# Patient Record
Sex: Male | Born: 1975 | Hispanic: No | Marital: Single | Smoking: Never smoker
Health system: Southern US, Community
[De-identification: ages and names within clinical notes are randomized; demographics above are authoritative.]

---

## 2019-07-31 ENCOUNTER — Encounter (HOSPITAL_BASED_OUTPATIENT_CLINIC_OR_DEPARTMENT_OTHER): Payer: Self-pay | Admitting: Emergency Medicine

## 2019-07-31 ENCOUNTER — Other Ambulatory Visit: Payer: Self-pay

## 2019-07-31 DIAGNOSIS — Y9389 Activity, other specified: Secondary | ICD-10-CM | POA: Insufficient documentation

## 2019-07-31 DIAGNOSIS — S96922A Laceration of unspecified muscle and tendon at ankle and foot level, left foot, initial encounter: Secondary | ICD-10-CM | POA: Insufficient documentation

## 2019-07-31 DIAGNOSIS — Y9289 Other specified places as the place of occurrence of the external cause: Secondary | ICD-10-CM | POA: Insufficient documentation

## 2019-07-31 DIAGNOSIS — Y999 Unspecified external cause status: Secondary | ICD-10-CM | POA: Insufficient documentation

## 2019-07-31 DIAGNOSIS — X500XXA Overexertion from strenuous movement or load, initial encounter: Secondary | ICD-10-CM | POA: Insufficient documentation

## 2019-07-31 NOTE — ED Triage Notes (Addendum)
Pt heard prior to triage through triage doors yelling at registration staff and using profanity. Pt can be heard yelling about not being called back yet and that "I pay taxes", and calling staff "racist". For this reason, triage completed with this RN and CN together.  Pt presents in ED c/o L foot pain. Pt motions to his visitor and says "she is my translator" in [place]. Pt refuses to answer some questions, and corrects or talks over the visitor for others. Pt/visitor state he was working in the shed 2 weeks ago and something heavy fell on his foot. Initially foot improved but ~4 days ago pt had increase in pain and swelling and "unable to bear weight". Visitor states he has taken Motrin, Tylenol and Codeine without relief. Pt/visitor notified that no rooms are available at this time due to high census but that he would be called back to a room as soon as one is available for him.

## 2019-08-01 ENCOUNTER — Emergency Department (HOSPITAL_BASED_OUTPATIENT_CLINIC_OR_DEPARTMENT_OTHER): Payer: Self-pay

## 2019-08-01 ENCOUNTER — Emergency Department (HOSPITAL_BASED_OUTPATIENT_CLINIC_OR_DEPARTMENT_OTHER)
Admission: EM | Admit: 2019-08-01 | Discharge: 2019-08-01 | Disposition: A | Discharge status: Home / Self Care | Payer: Self-pay | Attending: Emergency Medicine | Admitting: Emergency Medicine

## 2019-08-01 ENCOUNTER — Other Ambulatory Visit: Payer: Self-pay

## 2019-08-01 DIAGNOSIS — T148XXA Other injury of unspecified body region, initial encounter: Secondary | ICD-10-CM

## 2019-08-01 MED ORDER — OXYCODONE-ACETAMINOPHEN 5-325 MG PO TABS: 1.0000 | ORAL_TABLET | ORAL | 0 refills | Status: AC | PRN

## 2019-08-01 NOTE — ED Notes (Addendum)
Pt was yelling out in the waiting room at the [employer] and security. I went out to speak with patient explaining that he would not be allowed to yell and curse at staff. Pt was yelling/cursing this RN because he had not been taken directly back to a room after triage. Explained multiple times that we would obtain an x-ray while he was in triage,but that currently all rooms in the back were occupied. Pt did not like this answer and continued to curse staff.

## 2019-08-01 NOTE — ED Notes (Signed)
Pt visualized getting up from wheelchair and walking out the front door without assistance.

## 2019-08-01 NOTE — ED Notes (Addendum)
Registration staff called to state patient had rolled off the couch he was laying on in the lobby onto the floor. This RN and Charna Elizabeth, RN went to lobby to assess patient. He was lying in the floor. No obvious injury noted. Family member who had not previously been with the patient states he has been "drinking all day" and kept telling the patient to "be quiet" when talking inappropriately (using profanity)  to staff. Pt made a gesture to his family member as if he was cutting his throat, but pointing at this RN. The entire ED visit this patient has been yelling and talking inappropriately to staff including security and off duty [employer]. Dr. Read Drivers aware of this.

## 2019-08-01 NOTE — ED Provider Notes (Signed)
[facility] Provider Note: Georgena Spurling, MD, FACEP  CSN: [id number] MRN: [id number] ARRIVAL: 07/31/19 at 2244 ROOM: [place] [facility]   HISTORY OF PRESENT ILLNESS  08/01/19 2:17 AM Miguel Small is a 44 y.o. male who complains of pain in his lateral left foot following an inversion injury a week ago (NB: Nursing notes indicate he was working in his shed 2 weeks ago and something heavy fell on his foot).  He states the pain had improved but became worse over the past day with increased swelling and he is unable to bear weight.  He rates his pain is a 10 out of 10, worse with movement or attempted weightbearing he states that he is taken ibuprofen and Tylenol with codeine without adequate relief.  He denies putting any topical medication on it.   History reviewed. No pertinent past medical history.  History reviewed. No pertinent surgical history.  No family history on file.  Social History   Tobacco Use  . Smoking status: Never Smoker  Substance Use Topics  . Alcohol use: Never  . Drug use: Never    Prior to Admission medications   Medication Sig Start Date End Date Taking? Authorizing Provider  oxyCODONE-acetaminophen (PERCOCET) 5-325 MG tablet Take 1 tablet by mouth every 4 (four) hours as needed for severe pain. 08/01/19   Nguyet Mercer, Jenny Reichmann, MD    Allergies Patient has no known allergies.   REVIEW OF SYSTEMS  Negative except as noted here or in the History of Present Illness.   PHYSICAL EXAMINATION  Initial Vital Signs Blood pressure 134/82, pulse 100, temperature 98.7 F (37.1 C), temperature source Oral, resp. rate 18, height 5\' 11"  (1.803 m), weight 99.8 kg, SpO2 100 %.  Examination General: Well-developed, well-nourished male in no acute distress; appearance consistent with age of record HENT: normocephalic; atraumatic Eyes: pupils equal, round and reactive to light; extraocular muscles intact Neck: supple Heart: regular rate  and rhythm Lungs: clear to auscultation bilaterally Abdomen: soft; nondistended; nontender; no masses or hepatosplenomegaly; bowel sounds present Extremities: No deformity; full range of motion; pulses normal; tenderness, swelling and soft tissue irregularity of left foot with somewhat regular pattern of superficial abrasions or burns, obviously covered with some type of ointment:    Neurologic: Awake, alert; motor function intact in all extremities and symmetric; no facial droop Skin: Warm and dry Psychiatric: Angry mood with congruent affect   RESULTS  Summary of this visit's results, reviewed and interpreted by myself:   EKG Interpretation  Date/Time:  Sunday August 01 2019 02:27:31 EDT Ventricular Rate:  85 PR Interval:    QRS Duration: 80 QT Interval:  327 QTC Calculation: 389 R Axis:   4 Text Interpretation: Sinus rhythm Abnormal R-wave progression, early transition Inferior infarct, age indeterminate Lateral leads are also involved Baseline wander in lead(s) V2 No previous ECGs available Confirmed by Shanon Rosser [phone]) on 08/01/2019 2:33:47 AM      Laboratory Studies: No results found for this or any previous visit (from the past 24 hour(s)). Imaging Studies: DG Foot Complete Left  Result Date: 08/01/2019 CLINICAL DATA:  Foot pain EXAM: LEFT FOOT - COMPLETE 3+ VIEW COMPARISON:  None. FINDINGS: There is no evidence of fracture or dislocation. There is no evidence of arthropathy or other focal bone abnormality. The Achilles tendon appears to be intact. There is striated appearance to the myotendinous junction with question of a focal disruption of the posterior muscular compartment of the ankle. Mild dorsal  soft tissue swelling. IMPRESSION: There is muscular edema with a question of a focal disruption of the posterior muscular compartment of the ankle. If further evaluation is required would recommend MRI No acute osseous abnormality. Electronically Signed   By: Jonna Clark  M.D.   On: 08/01/2019 02:20    ED COURSE and MDM  Nursing notes, initial and subsequent vitals signs, including pulse oximetry, reviewed and interpreted by myself.  Vitals:   07/31/19 2302 08/01/19 0155  BP: (!) 134/91 134/82  Pulse: 89 100  Resp: (!) 21 18  Temp: 98.7 F (37.1 C)   TempSrc: Oral   SpO2: 95% 100%  Weight: 99.8 kg   Height: 5\' 11"  (1.803 m)    Medications - No data to display  The sudden worsening of pain and inability to bear weight after initial period of improvement suggest a muscle rupture.  This would be consistent with x-ray findings.  We will splint him and have him follow-up with orthopedics.  2:31 AM The patient's nurse now advises me he burned it with the tip of a barbecue prong in an effort to bring the swelling down.  As noted above the patient story has not been consistent from staff member to staff member.  PROCEDURES  Procedures   ED DIAGNOSES     ICD-10-CM   1. Muscle rupture  T14.8XXA        Aronda Burford, , MD 08/01/19 [phone]

## 2020-08-18 IMAGING — DX DG FOOT COMPLETE 3+V*L*
3 series · 3 of 3 positions shown · non-contrast
Comparison: None.

CLINICAL DATA: Foot pain

EXAM:
LEFT FOOT - COMPLETE 3+ VIEW

[foot ap]
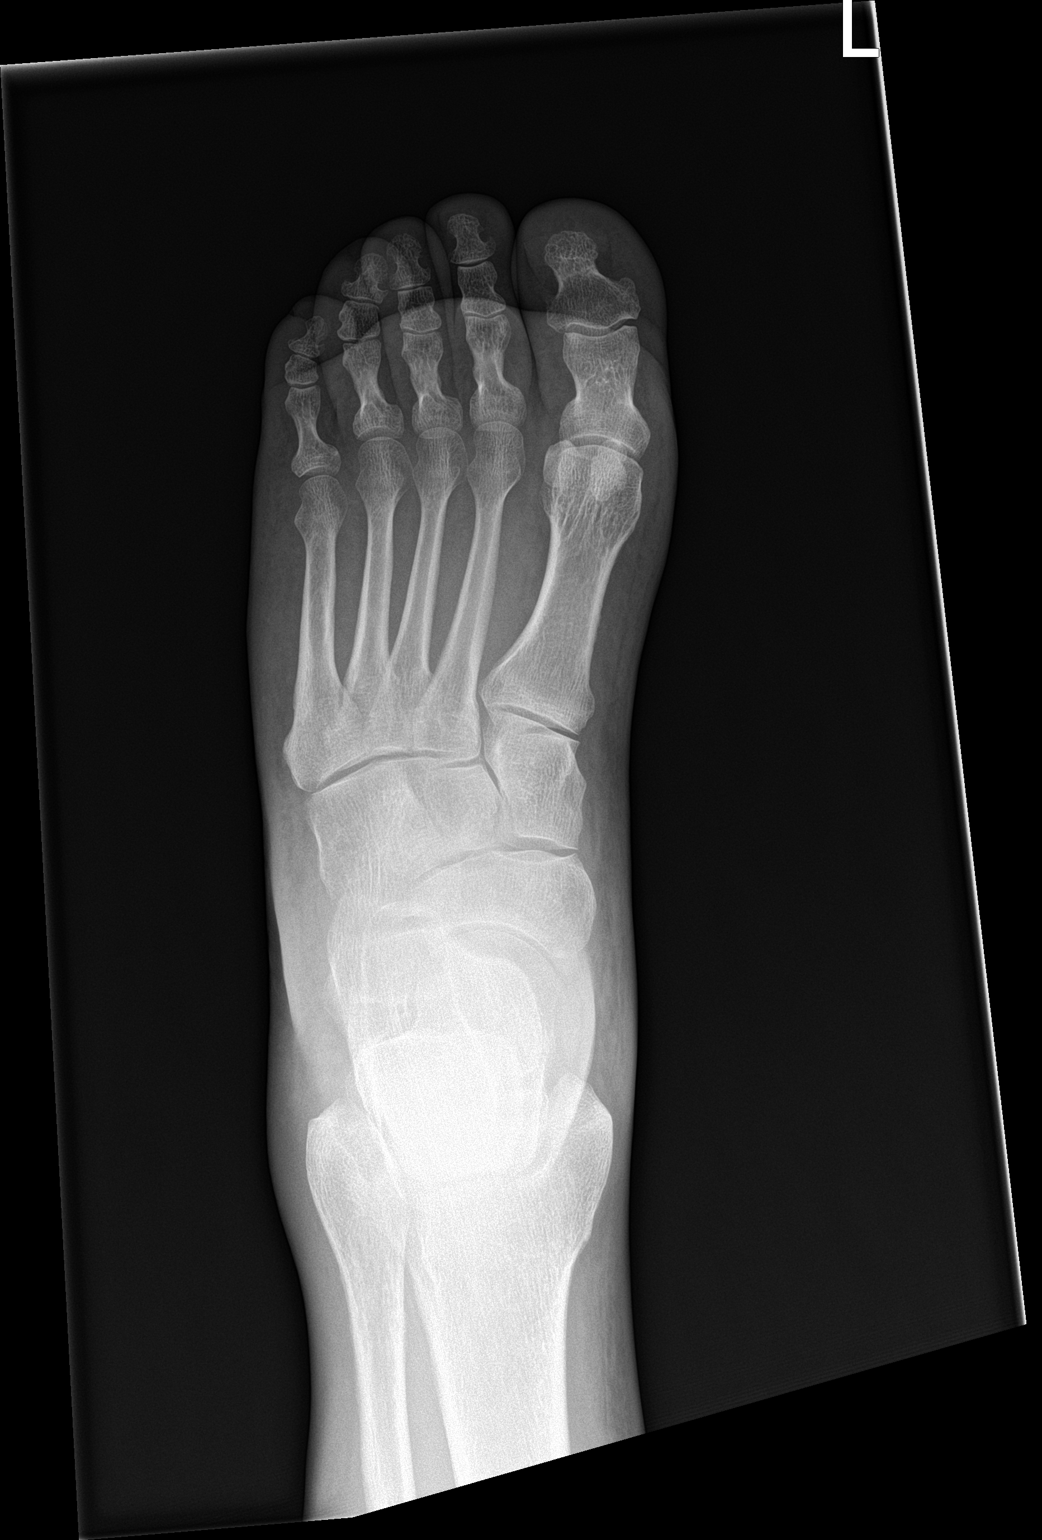

[foot obl]
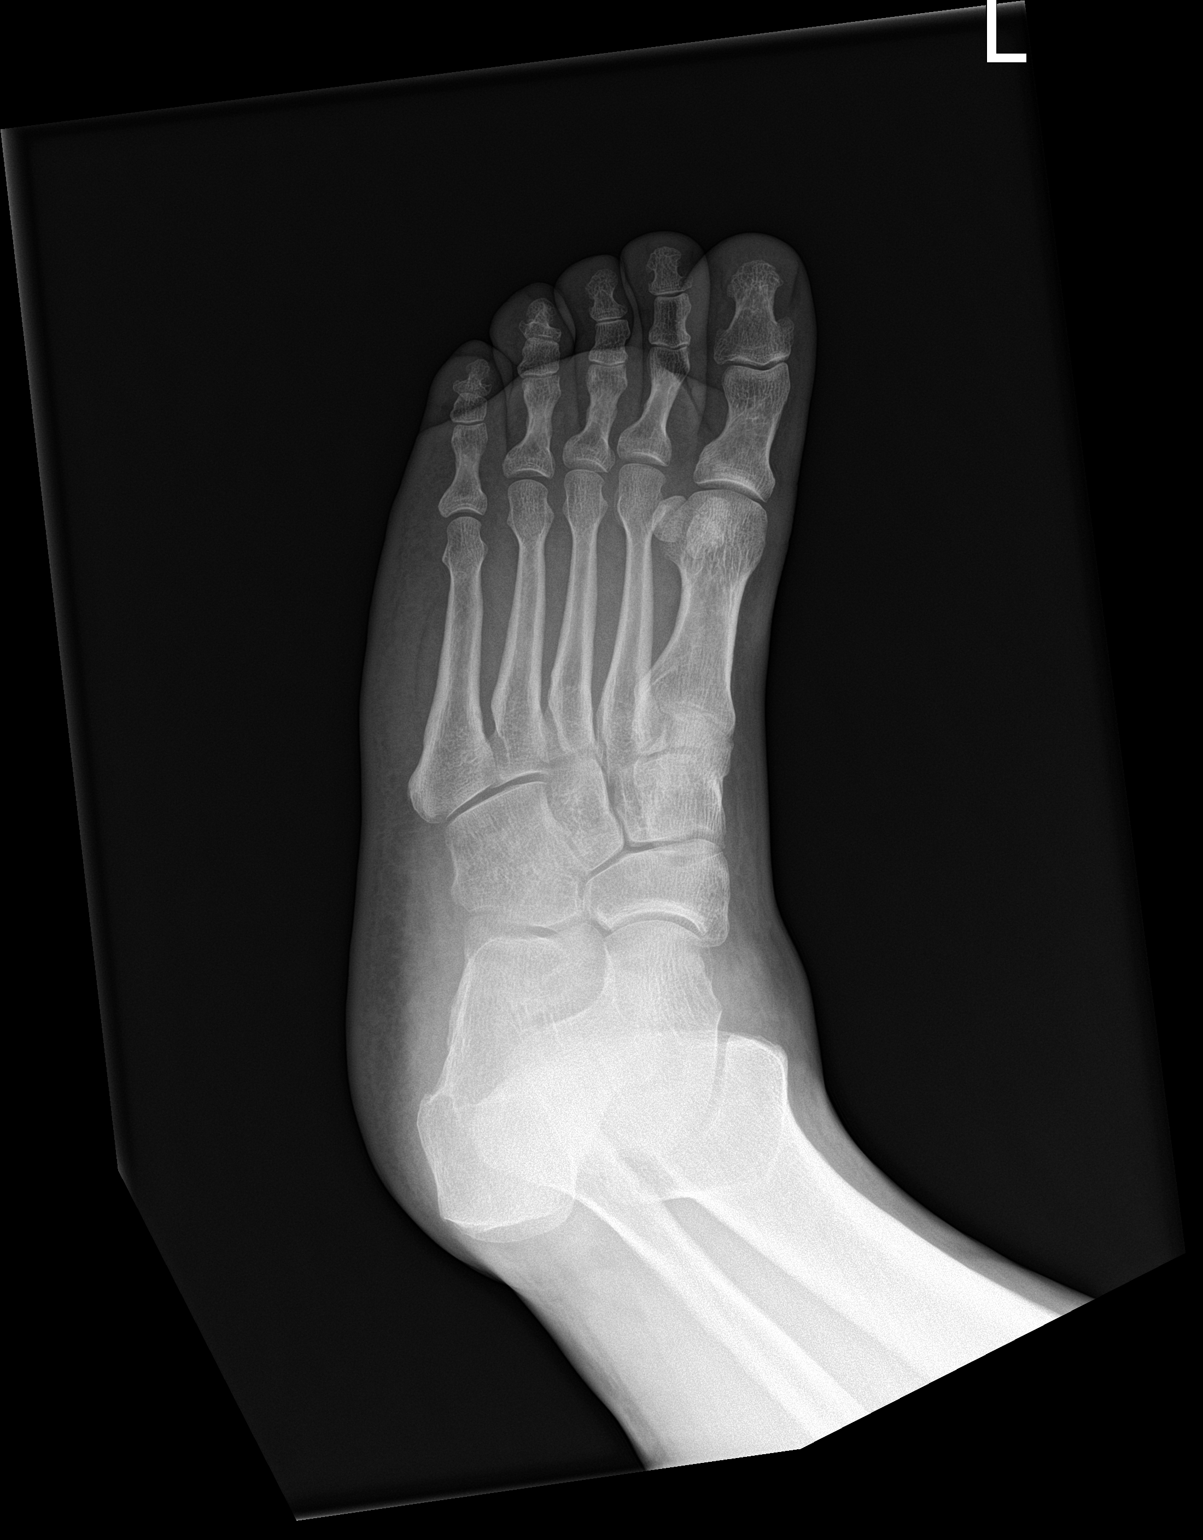

[foot lat]
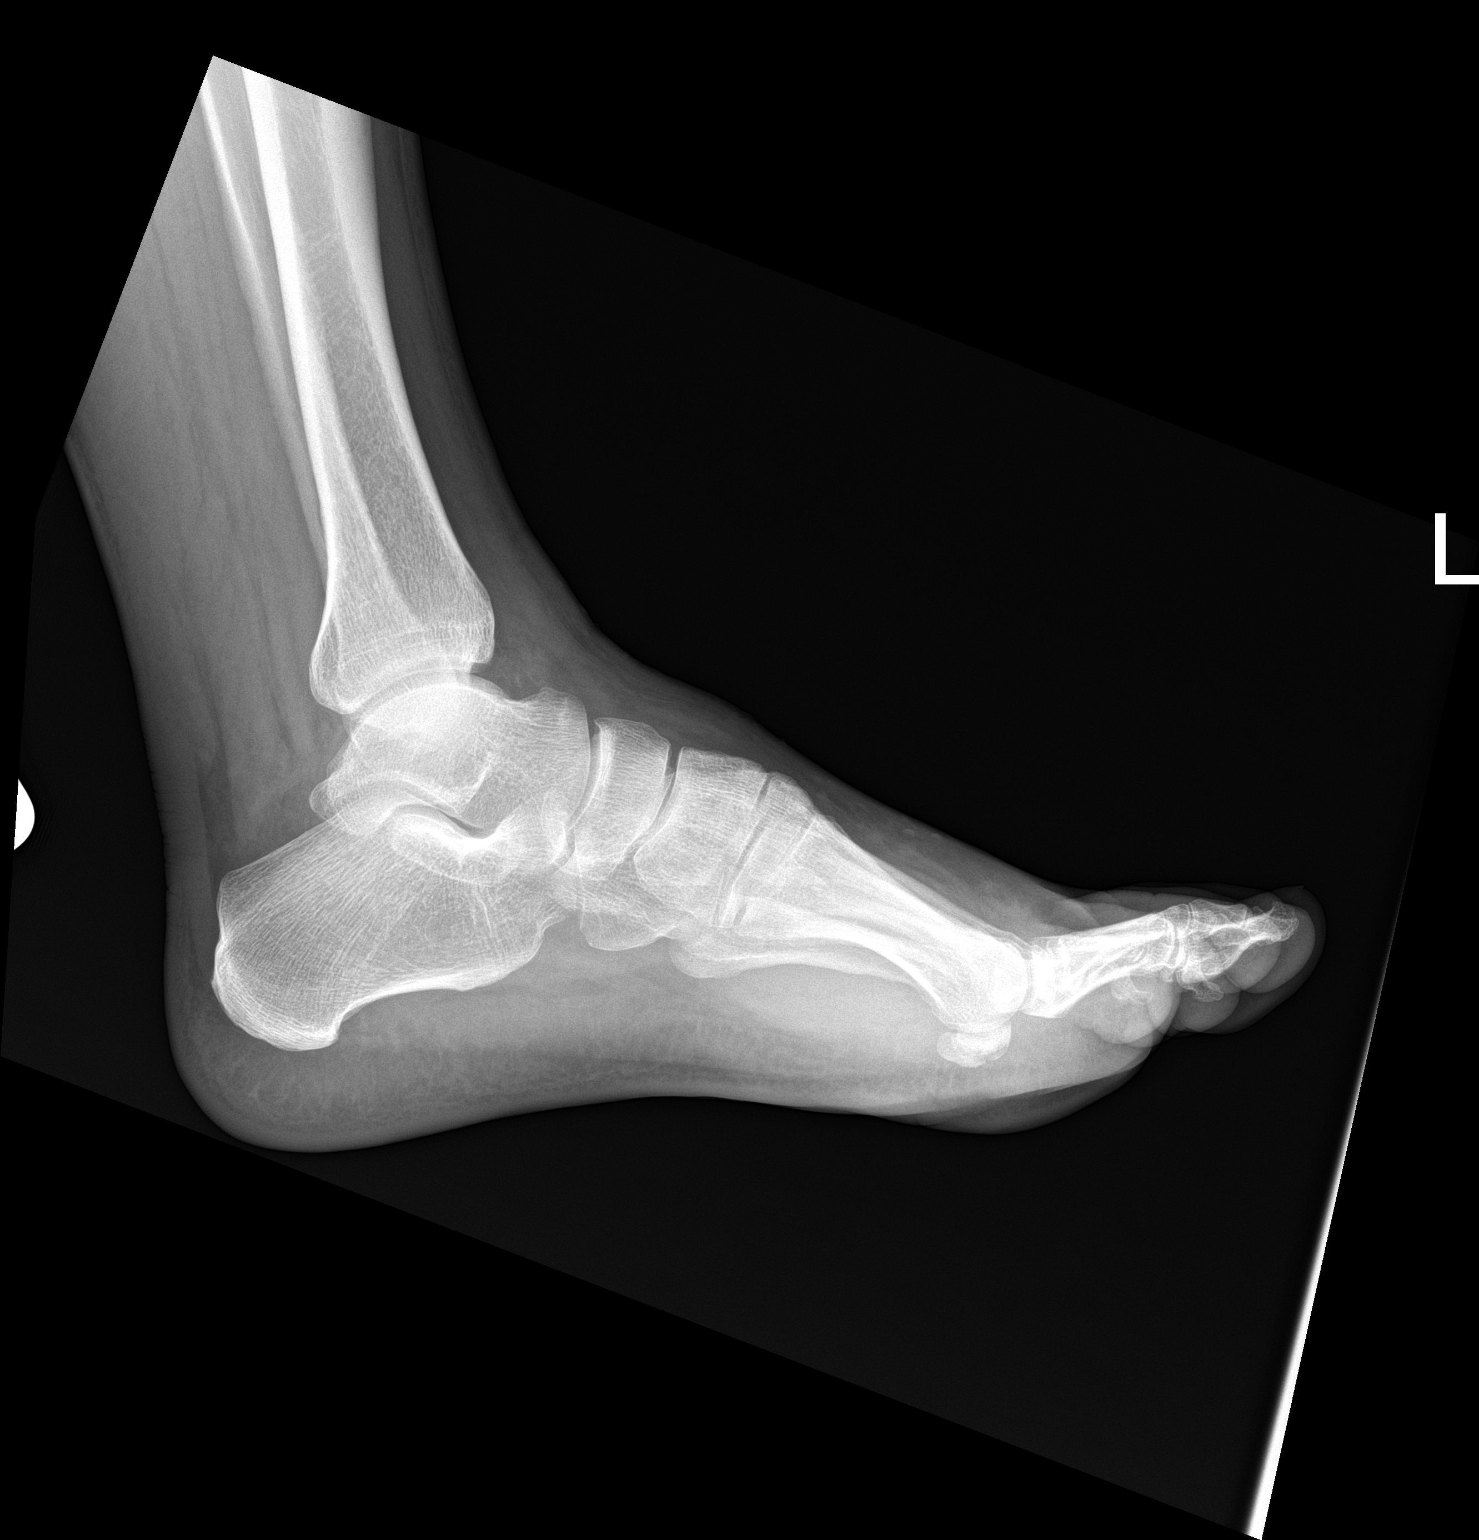

[3 of 3 positions shown; findings below may reference images not displayed]

FINDINGS: There is no evidence of fracture or dislocation. There is no
evidence of arthropathy or other focal bone abnormality. The
Achilles tendon appears to be intact. There is striated appearance
to the myotendinous junction with question of a focal disruption of
the posterior muscular compartment of the ankle. Mild dorsal soft
tissue swelling.
IMPRESSION: There is muscular edema with a question of a focal disruption of the
posterior muscular compartment of the ankle. If further evaluation
is required would recommend MRI

No acute osseous abnormality.
